# Patient Record
Sex: Male | Born: 1980 | Race: Black or African American | Hispanic: No | Marital: Single | Smoking: Current every day smoker
Health system: Southern US, Community
[De-identification: ages and names within clinical notes are randomized; demographics above are authoritative.]

## PROBLEM LIST (undated history)

## (undated) DIAGNOSIS — I10 Essential (primary) hypertension: Secondary | ICD-10-CM

## (undated) HISTORY — PX: JOINT REPLACEMENT: SHX530

---

## 2018-11-11 ENCOUNTER — Emergency Department (HOSPITAL_COMMUNITY)
Admission: EM | Admit: 2018-11-11 | Discharge: 2018-11-12 | Disposition: A | Discharge status: Home / Self Care | Payer: Medicaid - Out of State | Attending: Emergency Medicine | Admitting: Emergency Medicine

## 2018-11-11 ENCOUNTER — Other Ambulatory Visit: Payer: Self-pay

## 2018-11-11 ENCOUNTER — Encounter (HOSPITAL_COMMUNITY): Payer: Self-pay | Admitting: Emergency Medicine

## 2018-11-11 DIAGNOSIS — R109 Unspecified abdominal pain: Secondary | ICD-10-CM | POA: Diagnosis present

## 2018-11-11 DIAGNOSIS — I10 Essential (primary) hypertension: Secondary | ICD-10-CM | POA: Insufficient documentation

## 2018-11-11 DIAGNOSIS — F17210 Nicotine dependence, cigarettes, uncomplicated: Secondary | ICD-10-CM | POA: Diagnosis not present

## 2018-11-11 HISTORY — DX: Essential (primary) hypertension: I10

## 2018-11-11 LAB — CBC WITH DIFFERENTIAL/PLATELET
Abs Immature Granulocytes: 0.02 10*3/uL (ref 0.00–0.07)
Basophils Absolute: 0 10*3/uL (ref 0.0–0.1)
Basophils Relative: 0 %
Eosinophils Absolute: 0.1 10*3/uL (ref 0.0–0.5)
Eosinophils Relative: 1 %
HCT: 42.9 % (ref 39.0–52.0)
Hemoglobin: 13.5 g/dL (ref 13.0–17.0)
Immature Granulocytes: 0 %
Lymphocytes Relative: 26 %
Lymphs Abs: 2.5 10*3/uL (ref 0.7–4.0)
MCH: 28 pg (ref 26.0–34.0)
MCHC: 31.5 g/dL (ref 30.0–36.0)
MCV: 89 fL (ref 80.0–100.0)
Monocytes Absolute: 0.9 10*3/uL (ref 0.1–1.0)
Monocytes Relative: 9 %
Neutro Abs: 6.1 10*3/uL (ref 1.7–7.7)
Neutrophils Relative %: 64 %
Platelets: 314 10*3/uL (ref 150–400)
RBC: 4.82 MIL/uL (ref 4.22–5.81)
RDW: 14.2 % (ref 11.5–15.5)
WBC: 9.6 10*3/uL (ref 4.0–10.5)
nRBC: 0 % (ref 0.0–0.2)

## 2018-11-11 MED ORDER — HYDROMORPHONE HCL 1 MG/ML IJ SOLN
1.0000 mg | Freq: Once | INTRAMUSCULAR | Status: AC
  Administered 2018-11-11: 1 mg via INTRAVENOUS
  Filled 2018-11-11: qty 1

## 2018-11-11 MED ORDER — ONDANSETRON HCL 4 MG/2ML IJ SOLN
4.0000 mg | Freq: Once | INTRAMUSCULAR | Status: AC
  Administered 2018-11-11: 4 mg via INTRAVENOUS
  Filled 2018-11-11: qty 2

## 2018-11-11 MED ORDER — LACTATED RINGERS IV BOLUS
1000.0000 mL | Freq: Once | INTRAVENOUS | Status: AC
  Administered 2018-11-11: 1000 mL via INTRAVENOUS

## 2018-11-11 NOTE — ED Triage Notes (Signed)
C/O of bilateral flank pain radiating into abdomen. Pt also reports N/V/D.

## 2018-11-12 ENCOUNTER — Emergency Department (HOSPITAL_COMMUNITY): Payer: Medicaid - Out of State

## 2018-11-12 LAB — URINALYSIS, ROUTINE W REFLEX MICROSCOPIC
Bilirubin Urine: NEGATIVE
Glucose, UA: NEGATIVE mg/dL
Hgb urine dipstick: NEGATIVE
Ketones, ur: NEGATIVE mg/dL
Leukocytes,Ua: NEGATIVE
Nitrite: NEGATIVE
Protein, ur: NEGATIVE mg/dL
Specific Gravity, Urine: 1.016 (ref 1.005–1.030)
pH: 6 (ref 5.0–8.0)

## 2018-11-12 LAB — COMPREHENSIVE METABOLIC PANEL
ALT: 48 U/L — ABNORMAL HIGH (ref 0–44)
AST: 37 U/L (ref 15–41)
Albumin: 3.6 g/dL (ref 3.5–5.0)
Alkaline Phosphatase: 77 U/L (ref 38–126)
Anion gap: 8 (ref 5–15)
BUN: 14 mg/dL (ref 6–20)
CO2: 22 mmol/L (ref 22–32)
Calcium: 8.7 mg/dL — ABNORMAL LOW (ref 8.9–10.3)
Chloride: 103 mmol/L (ref 98–111)
Creatinine, Ser: 1.16 mg/dL (ref 0.61–1.24)
GFR calc Af Amer: >60 mL/min (ref >60)
GFR calc non Af Amer: >60 mL/min (ref >60)
Glucose, Bld: 123 mg/dL — ABNORMAL HIGH (ref 70–99)
Potassium: 3.6 mmol/L (ref 3.5–5.1)
Sodium: 133 mmol/L — ABNORMAL LOW (ref 135–145)
Total Bilirubin: 0.6 mg/dL (ref 0.3–1.2)
Total Protein: 7 g/dL (ref 6.5–8.1)

## 2018-11-12 LAB — LIPASE, BLOOD: Lipase: 53 U/L — ABNORMAL HIGH (ref 11–51)

## 2018-11-12 LAB — LACTIC ACID, PLASMA: Lactic Acid, Venous: 1.3 mmol/L (ref 0.5–1.9)

## 2018-11-12 MED ORDER — HYDROMORPHONE HCL 1 MG/ML IJ SOLN
1.0000 mg | Freq: Once | INTRAMUSCULAR | Status: AC
  Administered 2018-11-12: 1 mg via INTRAVENOUS
  Filled 2018-11-12: qty 1

## 2018-11-12 MED ORDER — PROMETHAZINE HCL 25 MG PO TABS: 25.0000 mg | ORAL_TABLET | Freq: Four times a day (QID) | ORAL | 0 refills | Status: AC | PRN

## 2018-11-12 MED ORDER — IOHEXOL 300 MG/ML  SOLN
100.0000 mL | Freq: Once | INTRAMUSCULAR | Status: AC | PRN
  Administered 2018-11-12: 100 mL via INTRAVENOUS

## 2018-11-12 MED ORDER — METOCLOPRAMIDE HCL 5 MG/ML IJ SOLN
10.0000 mg | Freq: Once | INTRAMUSCULAR | Status: AC
  Administered 2018-11-12: 01:00:00 10 mg via INTRAVENOUS
  Filled 2018-11-12: qty 2

## 2018-11-12 NOTE — ED Notes (Signed)
Taken to CT at this time. 

## 2018-11-12 NOTE — ED Provider Notes (Signed)
[facility] EMERGENCY DEPARTMENT Provider Note   CSN: [id number] Arrival date & time: 11/11/18  2300    History   Chief Complaint Chief Complaint  Patient presents with  . Flank Pain  . Abdominal Pain    HPI Clarence Paul is a 38 y.o. male.      Flank Pain  This is a recurrent problem. The current episode started yesterday. The problem occurs constantly. The problem has been gradually worsening. Associated symptoms include abdominal pain. Nothing aggravates the symptoms. Nothing relieves the symptoms. The treatment provided no relief.  Abdominal Pain  Associated symptoms: diarrhea and nausea   Associated symptoms: no constipation     Past Medical History:  Diagnosis Date  . Hypertension     There are no active problems to display for this patient.   Past Surgical History:  Procedure Laterality Date  . JOINT REPLACEMENT     HIP        Home Medications    Prior to Admission medications   Medication Sig Start Date End Date Taking? Authorizing Provider  promethazine (PHENERGAN) 25 MG tablet Take 1 tablet (25 mg total) by mouth every 6 (six) hours as needed for nausea or vomiting. 11/12/18   Kymberley Raz, Barbara Cower, MD    Family History History reviewed. No pertinent family history.  Social History Social History   Tobacco Use  . Smoking status: Current Every Day Smoker    Packs/day: 1.00    Types: Cigarettes  . Smokeless tobacco: Never Used  Substance Use Topics  . Alcohol use: Yes    Comment: occas  . Drug use: Yes    Frequency: 1.0 times per week    Types: Marijuana     Allergies   Tape   Review of Systems Review of Systems  Gastrointestinal: Positive for abdominal pain, diarrhea and nausea. Negative for abdominal distention, blood in stool and constipation.  Genitourinary: Positive for flank pain.  All other systems reviewed and are negative.    Physical Exam Updated Vital Signs BP (!) 134/92   Pulse 89   Resp 11   Ht 6'  (1.829 m)   Wt 136.1 kg   SpO2 95%   BMI 40.69 kg/m   Physical Exam Vitals signs and nursing note reviewed.  Constitutional:      Appearance: He is well-developed.  HENT:     Head: Normocephalic and atraumatic.  Neck:     Musculoskeletal: Normal range of motion.  Cardiovascular:     Rate and Rhythm: Normal rate.  Pulmonary:     Effort: Pulmonary effort is normal. No respiratory distress.  Abdominal:     General: Bowel sounds are normal. There is no distension.     Palpations: Abdomen is soft. There is no shifting dullness, fluid wave or hepatomegaly.     Tenderness: There is abdominal tenderness (no pain with deep palpation while auscultating). There is no guarding or rebound. Negative signs include Rovsing's sign.  Musculoskeletal: Normal range of motion.  Neurological:     Mental Status: He is alert.      ED Treatments / Results  Labs (all labs ordered are listed, but only abnormal results are displayed) Labs Reviewed  COMPREHENSIVE METABOLIC PANEL - Abnormal; Notable for the following components:      Result Value   Sodium 133 (*)    Glucose, Bld 123 (*)    Calcium 8.7 (*)    ALT 48 (*)    All other components within normal limits  LIPASE, BLOOD -  Abnormal; Notable for the following components:   Lipase 53 (*)    All other components within normal limits  CBC WITH DIFFERENTIAL/PLATELET  LACTIC ACID, PLASMA  URINALYSIS, ROUTINE W REFLEX MICROSCOPIC    EKG EKG Interpretation  Date/Time:  Sunday November 11 2018 23:13:36 EDT Ventricular Rate:  105 PR Interval:    QRS Duration: 86 QT Interval:  331 QTC Calculation: 438 R Axis:   62 Text Interpretation:  Sinus tachycardia Confirmed by Marily MemosMesner, Chidinma Clites [phone]([id number]) on 11/11/2018 11:20:25 PM   Radiology Ct Abdomen Pelvis W Contrast  Result Date: 11/12/2018 CLINICAL DATA:  Initial evaluation for acute bilateral flank pain radiating to abdomen with associated nausea, vomiting, diarrhea. EXAM: CT ABDOMEN AND PELVIS WITH  CONTRAST TECHNIQUE: Multidetector CT imaging of the abdomen and pelvis was performed using the standard protocol following bolus administration of intravenous contrast. CONTRAST:  100mL OMNIPAQUE IOHEXOL 300 MG/ML  SOLN COMPARISON:  None. FINDINGS: Lower chest: Minimal scattered subsegmental atelectatic changes seen within the visualized lung bases. Visualized lungs are otherwise clear. Hepatobiliary: Few scattered subcentimeter hypodensities noted within liver, too small the characterize, but could reflect small cysts versus biliary hamartomas. Liver otherwise unremarkable. Gallbladder within normal limits. No biliary dilatation. Pancreas: Pancreas within normal limits. Spleen: Spleen within normal limits. Adrenals/Urinary Tract: Adrenal glands are normal. Kidneys equal in size with symmetric enhancement. 4 mm nonobstructive stone present within the lower pole of the right kidney. No other radiopaque calculi identified. No stones seen along the course of either renal collecting system. No hydronephrosis or hydroureter. No focal enhancing renal mass. Bladder largely decompressed without acute finding. Mild sick com frontal bladder wall thickening likely related incomplete distension. Stomach/Bowel: Stomach within normal limits. No evidence for bowel obstruction. Normal appendix. Colon largely decompressed. No acute inflammatory changes seen about the bowels. Vascular/Lymphatic: Normal intravascular enhancement seen throughout the intra-abdominal aorta. Mesenteric vessels patent proximally. Minimal atherosclerotic change noted. No adenopathy. Reproductive: Prostate and seminal vesicles within normal limits. Other: No free air or fluid. Musculoskeletal: No acute osseous finding. No discrete lytic or blastic osseous lesions. Fixation rod partially visualize within the left femur. IMPRESSION: 1. No CT evidence for acute intra-abdominal or pelvic process. 2. 4 mm nonobstructive right renal nephrolithiasis. 3. Normal  appendix. Electronically Signed   By: Rise MuBenjamin  McClintock M.D.   On: 11/12/2018 00:52    Procedures Procedures (including critical care time)  Medications Ordered in ED Medications  lactated ringers bolus 1,000 mL (0 mLs Intravenous Stopped 11/12/18 0155)  HYDROmorphone (DILAUDID) injection 1 mg (1 mg Intravenous Given 11/11/18 2331)  ondansetron (ZOFRAN) injection 4 mg (4 mg Intravenous Given 11/11/18 2331)  iohexol (OMNIPAQUE) 300 MG/ML solution 100 mL (100 mLs Intravenous Contrast Given 11/12/18 0018)  HYDROmorphone (DILAUDID) injection 1 mg (1 mg Intravenous Given 11/12/18 0101)  metoCLOPramide (REGLAN) injection 10 mg (10 mg Intravenous Given 11/12/18 0059)     Initial Impression / Assessment and Plan / ED Course  I have reviewed the triage vital signs and the nursing notes.  Pertinent labs & imaging results that were available during my care of the patient were reviewed by me and considered in my medical decision making (see chart for details).        Unclear etiology for symptoms. Possibly gaseous distension? No vomiting in ED.   Review of records and patient not entirely truthful. Had ct done a couple months ago and this was similar to todays. Mildly elevated LFT's are actually improving. Mildly elevated lipase likely related to not eating, no e/o pancreatitis  on CT scan.   Stable for discharge.   Final Clinical Impressions(s) / ED Diagnoses   Final diagnoses:  Abdominal pain, unspecified abdominal location    ED Discharge Orders         Ordered    promethazine (PHENERGAN) 25 MG tablet  Every 6 hours PRN     11/12/18 0153           Inella Kuwahara, Barbara Cower, MD 11/12/18 0725

## 2018-11-12 NOTE — ED Notes (Signed)
Discharge instructions, prescription, and follow up care discussed with pt. Pt. Verbalized understanding. No questions at this time.  Registration at bedside.

## 2020-04-09 IMAGING — CT CT ABDOMEN AND PELVIS WITH CONTRAST
2 of 4 series · 16 of 46 positions shown, 18 images · IV contrast (APPLIED)
Comparison: None.

CLINICAL DATA: Initial evaluation for acute bilateral flank pain
radiating to abdomen with associated nausea, vomiting, diarrhea.

EXAM:
CT ABDOMEN AND PELVIS WITH CONTRAST
TECHNIQUE: Multidetector CT imaging of the abdomen and pelvis was performed
using the standard protocol following bolus administration of
intravenous contrast.
CONTRAST:  100mL OMNIPAQUE IOHEXOL 300 MG/ML  SOLN

[Series 3: abdomen/pelv 5.0 · axial · 0.97mm/px · z∈[+810,+1250]mm · 13 of 100 slices shown, 15 images]
[im 6/100  soft-tissue]
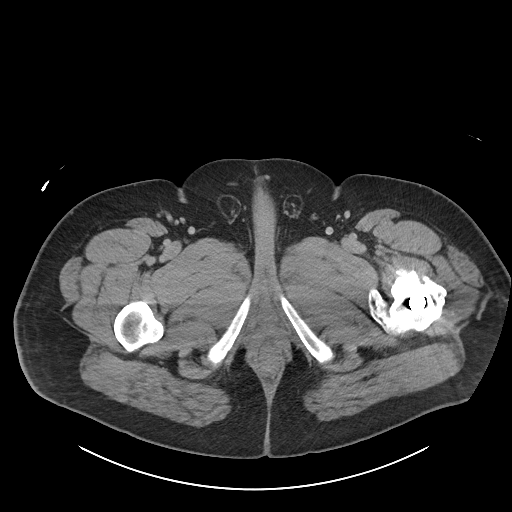
[im 6/100  bone]
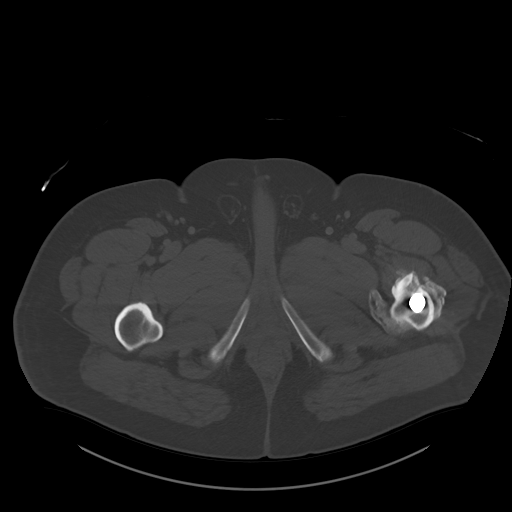
[im 12/100  soft-tissue]
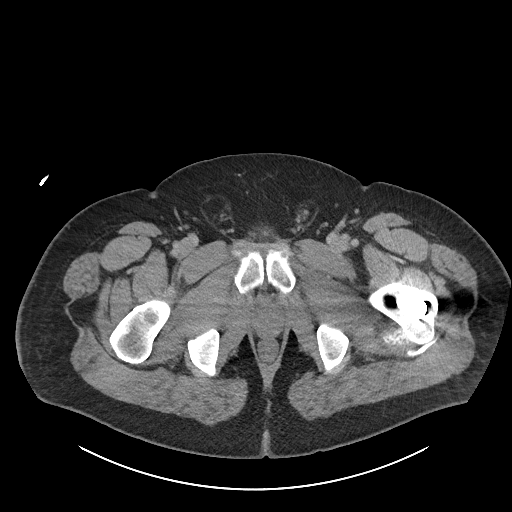
[im 23/100  soft-tissue]
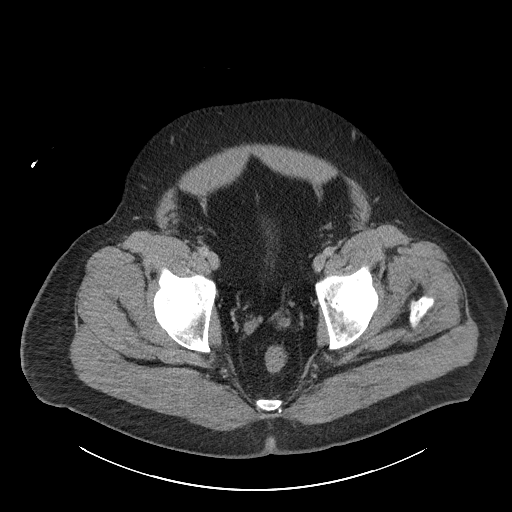
[im 28/100  soft-tissue]
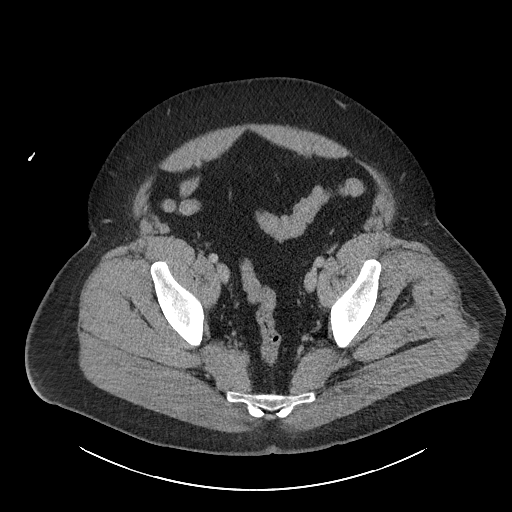
[im 34/100  soft-tissue]
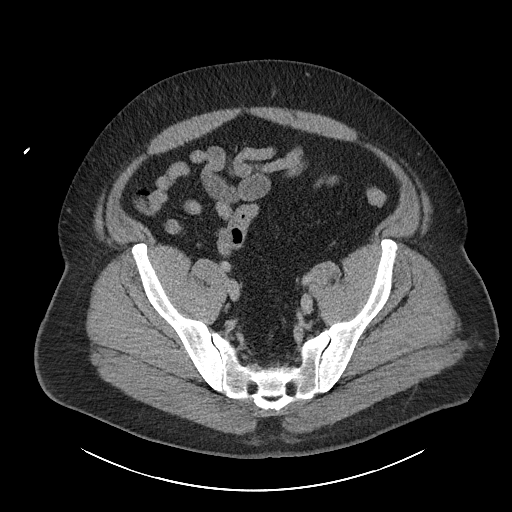
[im 45/100  soft-tissue]
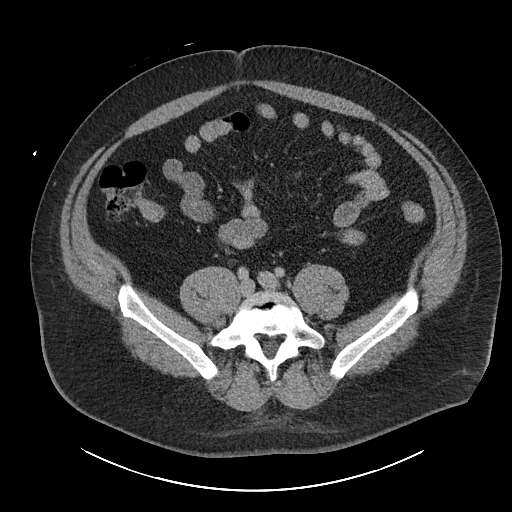
[im 50/100  soft-tissue]
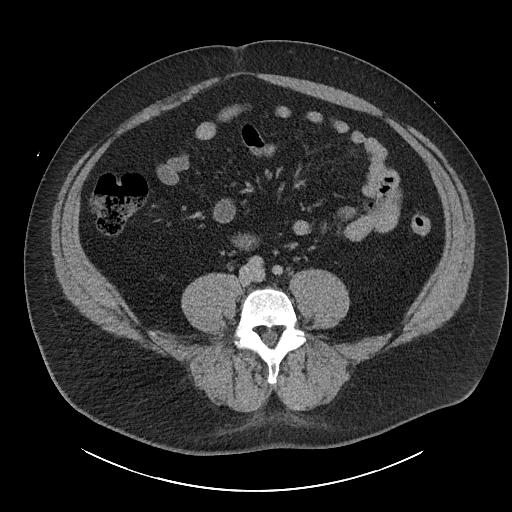
[im 56/100  soft-tissue]
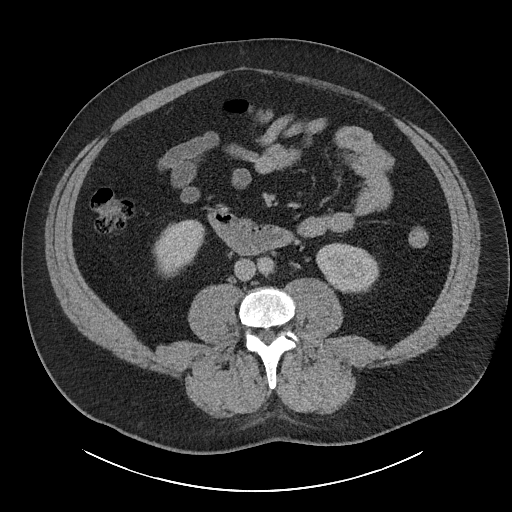
[im 67/100  soft-tissue]
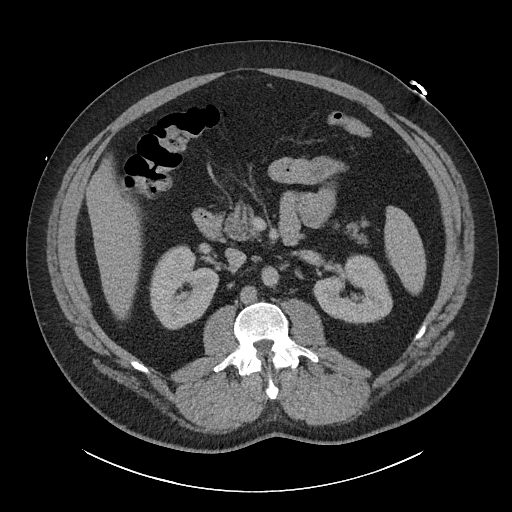
[im 67/100  bone]
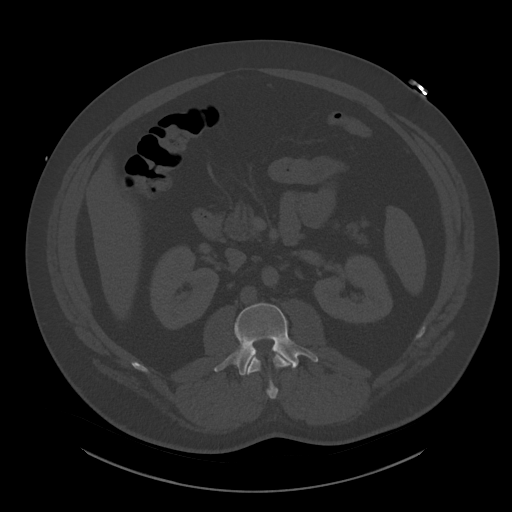
[im 72/100  soft-tissue]
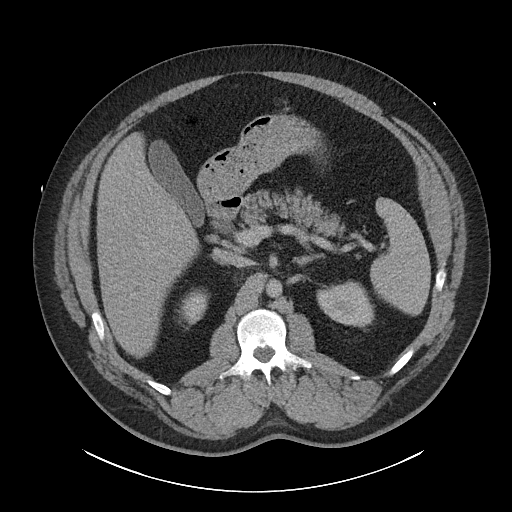
[im 78/100  soft-tissue]
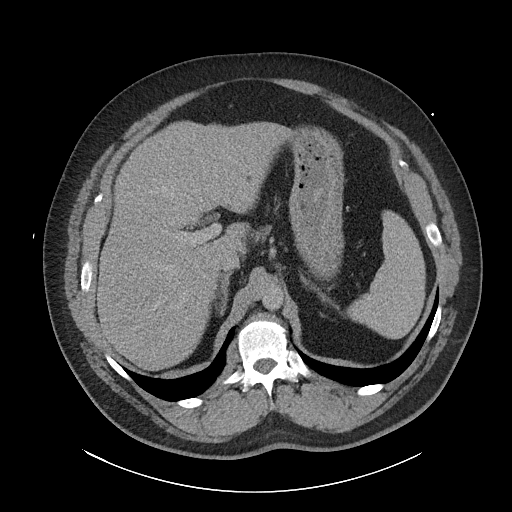
[im 89/100  soft-tissue]
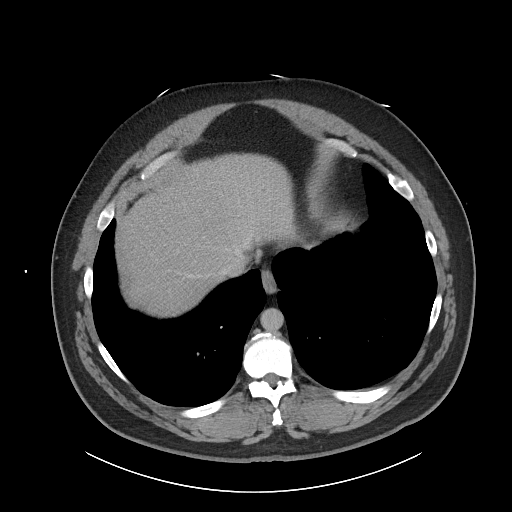
[im 94/100  soft-tissue]
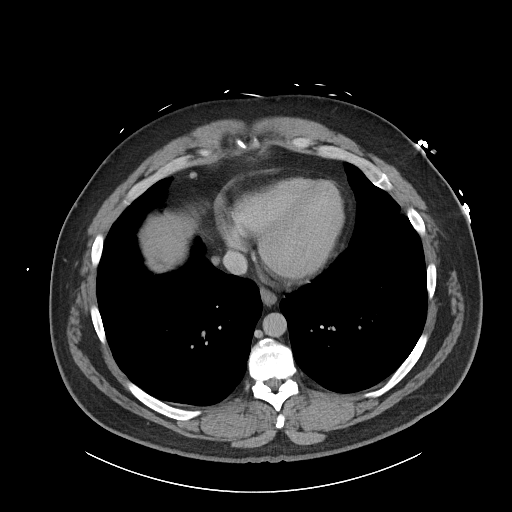

[Series 6: abdomen/pelv 3.0 mpr cor · coronal · 0.97mm/px · 3 of 136 slices shown]
[im 46/136  soft-tissue]
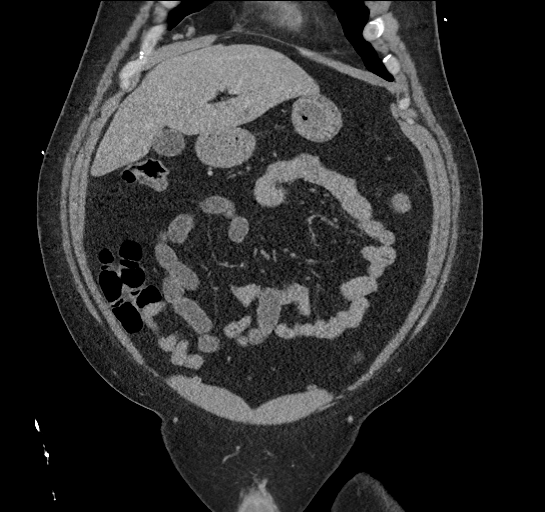
[im 61/136  soft-tissue]
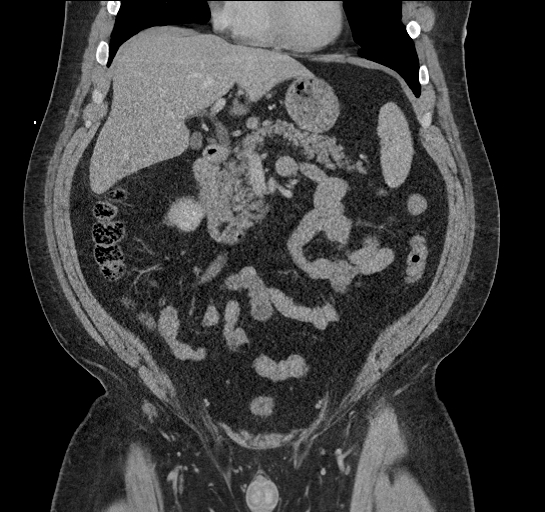
[im 76/136  soft-tissue]
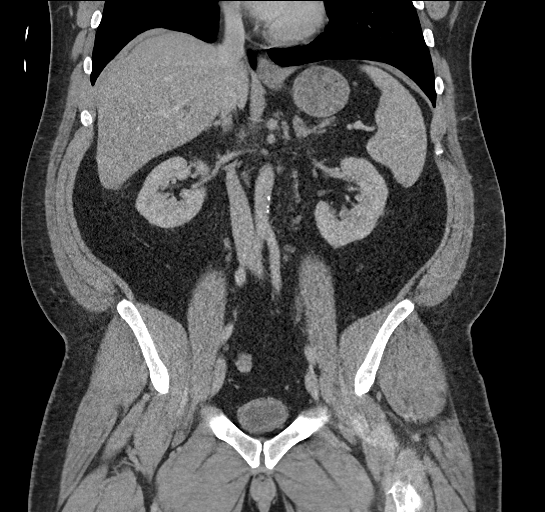

[16 of 46 positions shown; findings below may reference images not displayed]

FINDINGS: Lower chest: Minimal scattered subsegmental atelectatic changes seen
within the visualized lung bases. Visualized lungs are otherwise
clear.

Hepatobiliary: Few scattered subcentimeter hypodensities noted
within liver, too small the characterize, but could reflect small
cysts versus biliary hamartomas. Liver otherwise unremarkable.
Gallbladder within normal limits. No biliary dilatation.

Pancreas: Pancreas within normal limits.

Spleen: Spleen within normal limits.

Adrenals/Urinary Tract: Adrenal glands are normal.

Kidneys equal in size with symmetric enhancement. 4 mm
nonobstructive stone present within the lower pole of the right
kidney. No other radiopaque calculi identified. No stones seen along
the course of either renal collecting system. No hydronephrosis or
hydroureter. No focal enhancing renal mass. Bladder largely
decompressed without acute finding. Mild sick com frontal bladder
wall thickening likely related incomplete distension.

Stomach/Bowel: Stomach within normal limits. No evidence for bowel
obstruction. Normal appendix. Colon largely decompressed. No acute
inflammatory changes seen about the bowels.

Vascular/Lymphatic: Normal intravascular enhancement seen throughout
the intra-abdominal aorta. Mesenteric vessels patent proximally.
Minimal atherosclerotic change noted. No adenopathy.

Reproductive: Prostate and seminal vesicles within normal limits.

Other: No free air or fluid.

Musculoskeletal: No acute osseous finding. No discrete lytic or
blastic osseous lesions. Fixation rod partially visualize within the
left femur.
IMPRESSION: 1. No CT evidence for acute intra-abdominal or pelvic process.
2. 4 mm nonobstructive right renal nephrolithiasis.
3. Normal appendix.
